# Patient Record
Sex: Male | Born: 2000 | Race: White | Hispanic: No | Marital: Single | Smoking: Current every day smoker
Health system: Southern US, Community
[De-identification: ages and names within clinical notes are randomized; demographics above are authoritative.]

## PROBLEM LIST (undated history)

## (undated) DIAGNOSIS — J45909 Unspecified asthma, uncomplicated: Secondary | ICD-10-CM

---
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
## 2023-08-30 DIAGNOSIS — F900 Attention-deficit hyperactivity disorder, predominantly inattentive type: Secondary | ICD-10-CM | POA: Diagnosis not present

## 2023-08-30 DIAGNOSIS — J4521 Mild intermittent asthma with (acute) exacerbation: Secondary | ICD-10-CM | POA: Diagnosis not present

## 2023-08-30 DIAGNOSIS — J302 Other seasonal allergic rhinitis: Secondary | ICD-10-CM | POA: Diagnosis not present

## 2023-08-30 DIAGNOSIS — Z1331 Encounter for screening for depression: Secondary | ICD-10-CM | POA: Diagnosis not present

## 2023-08-30 DIAGNOSIS — Z6827 Body mass index (BMI) 27.0-27.9, adult: Secondary | ICD-10-CM | POA: Diagnosis not present

## 2023-08-30 DIAGNOSIS — E663 Overweight: Secondary | ICD-10-CM | POA: Diagnosis not present

## 2023-08-30 DIAGNOSIS — Z0001 Encounter for general adult medical examination with abnormal findings: Secondary | ICD-10-CM | POA: Diagnosis not present

## 2023-11-12 DIAGNOSIS — A084 Viral intestinal infection, unspecified: Secondary | ICD-10-CM | POA: Diagnosis not present

## 2024-01-01 DIAGNOSIS — M542 Cervicalgia: Secondary | ICD-10-CM | POA: Diagnosis not present

## 2024-01-01 DIAGNOSIS — R7989 Other specified abnormal findings of blood chemistry: Secondary | ICD-10-CM | POA: Diagnosis not present

## 2024-01-01 DIAGNOSIS — M546 Pain in thoracic spine: Secondary | ICD-10-CM | POA: Diagnosis not present

## 2024-01-01 DIAGNOSIS — Z131 Encounter for screening for diabetes mellitus: Secondary | ICD-10-CM | POA: Diagnosis not present

## 2024-01-01 DIAGNOSIS — F17200 Nicotine dependence, unspecified, uncomplicated: Secondary | ICD-10-CM | POA: Diagnosis not present

## 2024-01-01 DIAGNOSIS — G8929 Other chronic pain: Secondary | ICD-10-CM | POA: Diagnosis not present

## 2024-01-01 DIAGNOSIS — Z1329 Encounter for screening for other suspected endocrine disorder: Secondary | ICD-10-CM | POA: Diagnosis not present

## 2024-01-01 DIAGNOSIS — Z13220 Encounter for screening for lipoid disorders: Secondary | ICD-10-CM | POA: Diagnosis not present

## 2024-01-01 DIAGNOSIS — Z7689 Persons encountering health services in other specified circumstances: Secondary | ICD-10-CM | POA: Diagnosis not present
